# Patient Record
Sex: Male | Born: 1960 | Race: White | Hispanic: No | Marital: Married | Smoking: Never smoker
Health system: Southern US, Community
[De-identification: ages and names within clinical notes are randomized; demographics above are authoritative.]

## PROBLEM LIST (undated history)

## (undated) DIAGNOSIS — I10 Essential (primary) hypertension: Secondary | ICD-10-CM

## (undated) HISTORY — PX: CHOLECYSTECTOMY: SHX55

## (undated) HISTORY — PX: OTHER SURGICAL HISTORY: SHX169

## (undated) HISTORY — PX: UMBILICAL HERNIA REPAIR: SHX196

---

## 2016-04-23 ENCOUNTER — Encounter: Payer: Self-pay | Admitting: Nurse Practitioner

## 2016-04-23 ENCOUNTER — Ambulatory Visit (INDEPENDENT_AMBULATORY_CARE_PROVIDER_SITE_OTHER): Payer: BLUE CROSS/BLUE SHIELD | Admitting: Nurse Practitioner

## 2016-04-23 VITALS — BP 116/76 | Ht 70.5 in | Wt 243.2 lb

## 2016-04-23 DIAGNOSIS — Z8739 Personal history of other diseases of the musculoskeletal system and connective tissue: Secondary | ICD-10-CM

## 2016-04-23 DIAGNOSIS — Z131 Encounter for screening for diabetes mellitus: Secondary | ICD-10-CM | POA: Diagnosis not present

## 2016-04-23 DIAGNOSIS — Z0000 Encounter for general adult medical examination without abnormal findings: Secondary | ICD-10-CM

## 2016-04-23 DIAGNOSIS — Z125 Encounter for screening for malignant neoplasm of prostate: Secondary | ICD-10-CM | POA: Diagnosis not present

## 2016-04-23 DIAGNOSIS — E291 Testicular hypofunction: Secondary | ICD-10-CM

## 2016-04-23 MED ORDER — PHENTERMINE HCL 37.5 MG PO TABS: 37.5000 mg | ORAL_TABLET | Freq: Every day | ORAL | 0 refills | Status: DC

## 2016-04-23 NOTE — Progress Notes (Signed)
   Subjective:    Patient ID: Karl Russell, male    DOB: August 02, 1960, 55 y.o.   MRN: [id number]  HPI presents for his wellness exam as a new patient. Other than low testosterone, he denies any major health issues. 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 Has lost a little weight lately. Has a remote history of gout, over 2 years ago, none since. Controlled by avoiding certain foods such as asparagus. Married, same sexual partner. Needs eye and dental exams. Recently got insurance.     Review of Systems  Constitutional: Negative for activity change, appetite change and fatigue.  HENT: Negative for dental problem, ear pain, sinus pressure and sore throat.   Respiratory: Negative for cough, chest tightness, shortness of breath and wheezing.   Cardiovascular: Negative for chest pain.  Gastrointestinal: Negative for abdominal distention, abdominal pain, blood in stool, constipation, diarrhea, nausea and vomiting.  Genitourinary: Negative for difficulty urinating, discharge, dysuria, enuresis, frequency, genital sores, penile pain, penile swelling, scrotal swelling, testicular pain and urgency.       Objective:   Physical Exam  Constitutional: He is oriented to person, place, and time. He appears well-developed and well-nourished.  HENT:  Right Ear: External ear normal.  Left Ear: External ear normal.  Mouth/Throat: Oropharynx is clear and moist.  Neck: Normal range of motion. Neck supple. No tracheal deviation present. No thyromegaly present.  Cardiovascular: Normal rate, regular rhythm and normal heart sounds.   Pulmonary/Chest: Effort normal and breath sounds normal.  Abdominal: Soft. He exhibits no distension. There is no tenderness.  Genitourinary: Prostate normal and penis normal.  Genitourinary Comments: Testes palpated in scrotum bilat. No hernia. No stool for hemoccult.   Musculoskeletal: He exhibits no edema.  Lymphadenopathy:    He has no cervical  adenopathy.  Neurological: He is alert and oriented to person, place, and time.  Skin: Skin is warm and dry. No rash noted.  Psychiatric: He has a normal mood and affect. His behavior is normal. Thought content normal.  Vitals reviewed.         Assessment & Plan:  Routine general medical examination at a health care facility - Plan: Lipid panel  History of gout - Plan: Uric acid, Hepatic function panel  Hypogonadism in male - Plan: Testosterone  Special screening for malignant neoplasm of prostate - Plan: PSA  Encounter for screening examination for impaired glucose regulation and diabetes mellitus - Plan: Basic metabolic panel  Morbid obesity ([place])  Requests med to help jumpstart weight loss. Meds ordered this encounter  Medications  . phentermine (ADIPEX-P) 37.5 MG tablet    Sig: Take 1 tablet (37.5 mg total) by mouth daily before breakfast.    Dispense:  30 tablet    Refill:  0    Order Specific Question:   Supervising Provider    Answer:   Maggie Font   Reviewed potential adverse effects. DC med and call if any problems. Otherwise follow up in one month if he wants to continue. Discussed healthy diet and regular activity. Labs pending.  Return in about 1 year (around 04/23/2017) for physical.

## 2016-04-24 LAB — BASIC METABOLIC PANEL
BUN/Creatinine Ratio: 22 — ABNORMAL HIGH (ref 9–20)
BUN: 24 mg/dL (ref 6–24)
CHLORIDE: 102 mmol/L (ref 96–106)
CO2: 24 mmol/L (ref 18–29)
Calcium: 9.6 mg/dL (ref 8.7–10.2)
Creatinine, Ser: 1.08 mg/dL (ref 0.76–1.27)
GFR, EST AFRICAN AMERICAN: 88 mL/min/{1.73_m2} (ref >59)
GFR, EST NON AFRICAN AMERICAN: 76 mL/min/{1.73_m2} (ref >59)
Glucose: 89 mg/dL (ref 65–99)
Potassium: 5 mmol/L (ref 3.5–5.2)
Sodium: 141 mmol/L (ref 134–144)

## 2016-04-24 LAB — HEPATIC FUNCTION PANEL
ALK PHOS: 68 IU/L (ref 39–117)
ALT: 41 IU/L (ref 0–44)
AST: 25 IU/L (ref 0–40)
Albumin: 4.7 g/dL (ref 3.5–5.5)
Bilirubin Total: 0.5 mg/dL (ref 0.0–1.2)
Bilirubin, Direct: 0.13 mg/dL (ref 0.00–0.40)
TOTAL PROTEIN: 7.2 g/dL (ref 6.0–8.5)

## 2016-04-24 LAB — LIPID PANEL
Chol/HDL Ratio: 4.1 ratio units (ref 0.0–5.0)
Cholesterol, Total: 211 mg/dL — ABNORMAL HIGH (ref 100–199)
HDL: 51 mg/dL (ref >39)
LDL Calculated: 133 mg/dL — ABNORMAL HIGH (ref 0–99)
TRIGLYCERIDES: 137 mg/dL (ref 0–149)
VLDL CHOLESTEROL CAL: 27 mg/dL (ref 5–40)

## 2016-04-24 LAB — TESTOSTERONE: Testosterone: 241 ng/dL — ABNORMAL LOW (ref 264–916)

## 2016-04-24 LAB — PSA: PROSTATE SPECIFIC AG, SERUM: 3.2 ng/mL (ref 0.0–4.0)

## 2016-04-24 LAB — URIC ACID: URIC ACID: 8.5 mg/dL (ref 3.7–8.6)

## 2016-04-28 ENCOUNTER — Encounter: Payer: Self-pay | Admitting: Nurse Practitioner

## 2016-04-30 ENCOUNTER — Other Ambulatory Visit: Payer: Self-pay | Admitting: Nurse Practitioner

## 2016-04-30 ENCOUNTER — Encounter: Payer: Self-pay | Admitting: Nurse Practitioner

## 2016-04-30 DIAGNOSIS — Z1211 Encounter for screening for malignant neoplasm of colon: Secondary | ICD-10-CM

## 2016-05-01 ENCOUNTER — Encounter: Payer: Self-pay | Admitting: Family Medicine

## 2016-05-04 ENCOUNTER — Encounter (INDEPENDENT_AMBULATORY_CARE_PROVIDER_SITE_OTHER): Payer: Self-pay | Admitting: *Deleted

## 2016-05-29 ENCOUNTER — Ambulatory Visit (INDEPENDENT_AMBULATORY_CARE_PROVIDER_SITE_OTHER): Payer: BLUE CROSS/BLUE SHIELD | Admitting: Family Medicine

## 2016-05-29 ENCOUNTER — Encounter: Payer: Self-pay | Admitting: Family Medicine

## 2016-05-29 VITALS — BP 132/86 | Ht 70.5 in | Wt 245.0 lb

## 2016-05-29 DIAGNOSIS — N5201 Erectile dysfunction due to arterial insufficiency: Secondary | ICD-10-CM

## 2016-05-29 MED ORDER — SILDENAFIL CITRATE 50 MG PO TABS: 50.0000 mg | ORAL_TABLET | Freq: Every day | ORAL | 11 refills | Status: DC | PRN

## 2016-05-29 NOTE — Progress Notes (Signed)
   Subjective:    Patient ID: Karl Russell, male    DOB: 1961/03/01, 55 y.o.   MRN: [id number]  HPI  Patient arrives for a follow up on Adipex.  Pt years ago tied to lose weight with meds did not do much for pt  Fa's side of family  Overweight  Five yrs ago weighed 265 or so   Not xercising drives twenty five out of thiry   Patient notes ongoing challenges with directions. Has become more difficult in recent years. History of borderline low testosterone  Review of Systems No headache, no major weight loss or weight gain, no chest pain no back pain abdominal pain no change in bowel habits complete ROS otherwise negative     Objective:   Physical Exam  Alert vitals stable, NAD. Blood pressure good on repeat. HEENT normal. Lungs clear. Heart regular rate and rhythm.       Assessment & Plan:  Impression 1 erectile dysfunction discussed. Patient not a candidate for testosterone with levels borderline low normal. Options discussed. #2 weight loss medication has not really helped. Patient not infused about spending 100s of dollars on the new trade medications plan trial of Viagra. Refills written side effects discussed

## 2016-07-20 ENCOUNTER — Other Ambulatory Visit (INDEPENDENT_AMBULATORY_CARE_PROVIDER_SITE_OTHER): Payer: Self-pay | Admitting: *Deleted

## 2016-07-20 DIAGNOSIS — Z1211 Encounter for screening for malignant neoplasm of colon: Secondary | ICD-10-CM | POA: Insufficient documentation

## 2016-08-03 ENCOUNTER — Encounter: Payer: Self-pay | Admitting: Family Medicine

## 2016-08-03 ENCOUNTER — Ambulatory Visit (INDEPENDENT_AMBULATORY_CARE_PROVIDER_SITE_OTHER): Payer: BLUE CROSS/BLUE SHIELD | Admitting: Family Medicine

## 2016-08-03 VITALS — BP 150/98 | Ht 70.5 in | Wt 244.4 lb

## 2016-08-03 DIAGNOSIS — S46811A Strain of other muscles, fascia and tendons at shoulder and upper arm level, right arm, initial encounter: Secondary | ICD-10-CM | POA: Diagnosis not present

## 2016-08-03 DIAGNOSIS — G549 Nerve root and plexus disorder, unspecified: Secondary | ICD-10-CM

## 2016-08-03 MED ORDER — CHLORZOXAZONE 500 MG PO TABS: 500.0000 mg | ORAL_TABLET | Freq: Three times a day (TID) | ORAL | 0 refills | Status: DC | PRN

## 2016-08-03 MED ORDER — HYDROCODONE-ACETAMINOPHEN 7.5-325 MG PO TABS: ORAL_TABLET | ORAL | 0 refills | Status: DC

## 2016-08-03 MED ORDER — ETODOLAC 400 MG PO TABS: 400.0000 mg | ORAL_TABLET | Freq: Two times a day (BID) | ORAL | 0 refills | Status: DC

## 2016-08-03 NOTE — Progress Notes (Signed)
   Subjective:    Patient ID: Karl Russell, male    DOB: Nov 27, 1960, 56 y.o.   MRN: [id number]  Shoulder Pain   The pain is present in the right shoulder and right arm. This is a new problem. The current episode started in the past 7 days. The problem occurs constantly. The problem has been unchanged. The quality of the pain is described as aching and sharp. The pain is moderate. Associated symptoms include tingling. He has tried NSAIDS (prednisone) for the symptoms. The treatment provided no relief.  Patient was seen at [facility] ER yesterday for this issue.   Patient initially stretched. Felt a deep pain between scapula and spine. 30 minutes later is having a severe D spasm-like aching pain. Got worse. Bad enough To go to the emergency room. Negative x-ray blood work and EKG. Now pain worse with certain motions of the shoulder. And radiating to the right hand. No neck pain. No history of disc disease   Severe pain fight shoulder  Very bad   Went to ER via 911   Had ekg blpood work xrays all good  No injury recalled   Review of Systems  Neurological: Positive for tingling.       Objective:   Physical Exam Patient bracing right arm. No true impingement sign positive pain with rotation positive anterolateral and posterior lateral neck muscle ligament pain at palpation positive. Scapular pain distal strength sensation intact       Assessment & Plan:  Probable rhomboid/trapezius strain with spasm and secondary nerve irritation discussed plan prednisone then Lodine. Hydrocodone when necessary for pain. Local measures discussed. Muscle spasm medication prescribed work excuse given, follow-up as scheduled.

## 2016-08-05 DIAGNOSIS — Z029 Encounter for administrative examinations, unspecified: Secondary | ICD-10-CM

## 2016-08-10 ENCOUNTER — Encounter: Payer: Self-pay | Admitting: Family Medicine

## 2016-08-10 ENCOUNTER — Ambulatory Visit (INDEPENDENT_AMBULATORY_CARE_PROVIDER_SITE_OTHER): Payer: BLUE CROSS/BLUE SHIELD | Admitting: Family Medicine

## 2016-08-10 VITALS — BP 130/94 | Ht 70.5 in | Wt 244.0 lb

## 2016-08-10 DIAGNOSIS — M5412 Radiculopathy, cervical region: Secondary | ICD-10-CM | POA: Diagnosis not present

## 2016-08-10 MED ORDER — HYDROCODONE-ACETAMINOPHEN 7.5-325 MG PO TABS: ORAL_TABLET | ORAL | 0 refills | Status: DC

## 2016-08-10 MED ORDER — ETODOLAC 400 MG PO TABS: 400.0000 mg | ORAL_TABLET | Freq: Two times a day (BID) | ORAL | 0 refills | Status: DC

## 2016-08-10 NOTE — Progress Notes (Signed)
   Subjective:    Patient ID: Karl Russell, male    DOB: [birth year], 56 y.o.   MRN: [id number]  HPIFollow up on right shoulder pain. Pain radiates down arm. Pain is about the same. Taking chlorzoxazone, etodolac, and hydrocodone.   Pretty rough pain in the should  Rad to elbowAnd down through to the hand. Patient notes when he turns his neck the pain radiates all the way to his hand. Patient notes he feels weak in his hand as far as his prescription. He notes that shooting pains and numb sensations a worse in his thumb forefinger and middle finger of his right hand.   Pain is severe keeping the patient awake at night.   Not artic changed with shoulder motion  Unable to return to work per t, has to do a lot of lifting with job   Review of Systems No headache, no major weight loss or weight gain, no chest pain no back pain abdominal pain no change in bowel habits complete ROS otherwise negative     Objective:   Physical Exam Alert vitals stable, NAD.Patient in obvious pain holding his right arm. Blood pressure good on repeat. HEENT normal. Lungs clear. Heart regular rate and rhythm.  Right shoulder no impingement sign. Lateral rotation of the neck to the right leads to worsening of pain all the way into the hand. Right hand sensation diminished along the thumb forefinger and middle finger distribution. Grip diminished. 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 Accompanied by weakness and numbness and diminished reflexes. Exacerbated by rotation of the neck. Patient needs a cervical MRI ASAP. Discussed with patient. We will schedule this further recommendations based on results WSL

## 2016-08-11 ENCOUNTER — Ambulatory Visit: Payer: BLUE CROSS/BLUE SHIELD | Admitting: Family Medicine

## 2016-08-12 ENCOUNTER — Encounter: Payer: Self-pay | Admitting: Family Medicine

## 2016-08-12 ENCOUNTER — Telehealth: Payer: Self-pay | Admitting: *Deleted

## 2016-08-12 ENCOUNTER — Telehealth: Payer: Self-pay | Admitting: Family Medicine

## 2016-08-12 NOTE — Telephone Encounter (Signed)
MRI scheduled for this Friday. Need to have note to do precert

## 2016-08-12 NOTE — Telephone Encounter (Signed)
Ok change work excuse I will do dictation within next sixty minutes let brndale know

## 2016-08-12 NOTE — Telephone Encounter (Signed)
Patient was seen on 1/29 and was suppose to go back to work on  2/2 but MRI is that day also and needing work note redone for 2/5 with restriction if MRI shows anything.He would like results as soon as possible after its done.

## 2016-08-14 ENCOUNTER — Other Ambulatory Visit: Payer: Self-pay

## 2016-08-14 ENCOUNTER — Encounter: Payer: Self-pay | Admitting: Family Medicine

## 2016-08-14 ENCOUNTER — Ambulatory Visit (HOSPITAL_COMMUNITY)
Admission: RE | Admit: 2016-08-14 | Discharge: 2016-08-14 | Disposition: A | Discharge status: Home / Self Care | Payer: BLUE CROSS/BLUE SHIELD | Source: Ambulatory Visit | Attending: Family Medicine | Admitting: Family Medicine

## 2016-08-14 ENCOUNTER — Telehealth: Payer: Self-pay | Admitting: Family Medicine

## 2016-08-14 DIAGNOSIS — M5412 Radiculopathy, cervical region: Secondary | ICD-10-CM | POA: Diagnosis not present

## 2016-08-14 DIAGNOSIS — M5020 Other cervical disc displacement, unspecified cervical region: Secondary | ICD-10-CM

## 2016-08-14 DIAGNOSIS — M50223 Other cervical disc displacement at C6-C7 level: Secondary | ICD-10-CM | POA: Insufficient documentation

## 2016-08-14 DIAGNOSIS — M50222 Other cervical disc displacement at C5-C6 level: Secondary | ICD-10-CM | POA: Insufficient documentation

## 2016-08-14 DIAGNOSIS — M4802 Spinal stenosis, cervical region: Secondary | ICD-10-CM | POA: Diagnosis not present

## 2016-08-14 DIAGNOSIS — M50221 Other cervical disc displacement at C4-C5 level: Secondary | ICD-10-CM | POA: Diagnosis not present

## 2016-08-14 MED ORDER — HYDROCODONE-ACETAMINOPHEN 7.5-325 MG PO TABS: ORAL_TABLET | ORAL | 0 refills | Status: DC

## 2016-08-14 NOTE — Telephone Encounter (Signed)
Write for another 56, same rx

## 2016-08-14 NOTE — Telephone Encounter (Signed)
Faxed information to [facility] in [place] - they will call pt directly to schedule, pt aware   Pt will need another Rx for pain meds to take with him  Planning to fly up there Monday or Tuesday    Please advise

## 2016-08-14 NOTE — Telephone Encounter (Signed)
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  They are going to fly him out and would like this done ASAP.  Below is the information to the requested doctor:  Dr. Abran Richard Grief [phone] [facility] in [place], [place]

## 2016-08-14 NOTE — Addendum Note (Signed)
Addended by: Jesusita Oka on: 08/14/2016 04:28 PM   Modules accepted: Orders

## 2016-08-14 NOTE — Telephone Encounter (Signed)
If pt in agreement with that go ahead and ref to [facility], needs appt soon

## 2016-08-14 NOTE — Telephone Encounter (Signed)
Wife notified script ready for pickup

## 2016-09-11 ENCOUNTER — Encounter: Payer: Self-pay | Admitting: Family Medicine

## 2016-09-11 ENCOUNTER — Telehealth (INDEPENDENT_AMBULATORY_CARE_PROVIDER_SITE_OTHER): Payer: Self-pay | Admitting: *Deleted

## 2016-09-11 ENCOUNTER — Encounter (INDEPENDENT_AMBULATORY_CARE_PROVIDER_SITE_OTHER): Payer: Self-pay | Admitting: *Deleted

## 2016-09-11 NOTE — Telephone Encounter (Signed)
Patient needs trilyte 

## 2016-09-14 MED ORDER — PEG 3350-KCL-NA BICARB-NACL 420 G PO SOLR: 4000.0000 mL | Freq: Once | ORAL | 0 refills | Status: AC

## 2016-09-14 MED ORDER — AMLODIPINE BESY-BENAZEPRIL HCL 5-10 MG PO CAPS: 1.0000 | ORAL_CAPSULE | Freq: Every day | ORAL | 5 refills | Status: DC

## 2016-09-17 ENCOUNTER — Telehealth: Payer: Self-pay | Admitting: Family Medicine

## 2016-09-17 ENCOUNTER — Other Ambulatory Visit: Payer: Self-pay

## 2016-09-17 MED ORDER — AMLODIPINE BESY-BENAZEPRIL HCL 5-10 MG PO CAPS: 1.0000 | ORAL_CAPSULE | Freq: Every day | ORAL | 5 refills | Status: DC

## 2016-09-17 NOTE — Telephone Encounter (Signed)
Medication sent to requested pharmacy. Patient's wife notified.

## 2016-09-17 NOTE — Telephone Encounter (Signed)
Patient had amLODipine-benazepril (LOTREL) 5-10 MG called in the other day, but it was sent to the wrong pharmacy.  Patients spouse is requesting this to be sent to [facility], [place], [place]. W/ 2 refills.

## 2016-09-23 ENCOUNTER — Telehealth (INDEPENDENT_AMBULATORY_CARE_PROVIDER_SITE_OTHER): Payer: Self-pay | Admitting: *Deleted

## 2016-09-23 NOTE — Telephone Encounter (Signed)
Referring MD/PCP: steve luking   Procedure: tcs  Reason/Indication:  screening  Has patient had this procedure before?  no  If so, when, by whom and where?    Is there a family history of colon cancer?  no  Who?  What age when diagnosed?    Is patient diabetic?   no      Does patient have prosthetic heart valve or mechanical valve?  no  Do you have a pacemaker?  no  Has patient ever had endocarditis? no  Has patient had joint replacement within last 12 months?  no  Does patient tend to be constipated or take laxatives? no  Does patient have a history of alcohol/drug use?  no  Is patient on Coumadin, Plavix and/or Aspirin? no  Medications: vit c, MVI  Allergies: codeine  Medication Adjustment per Dr Laural Golden:   Procedure date & time:

## 2016-10-21 ENCOUNTER — Ambulatory Visit (HOSPITAL_COMMUNITY): Admit: 2016-10-21 | Payer: BLUE CROSS/BLUE SHIELD | Admitting: Internal Medicine

## 2016-10-21 ENCOUNTER — Encounter (HOSPITAL_COMMUNITY): Payer: Self-pay

## 2016-10-21 SURGERY — COLONOSCOPY
Anesthesia: Moderate Sedation

## 2016-11-04 ENCOUNTER — Other Ambulatory Visit (INDEPENDENT_AMBULATORY_CARE_PROVIDER_SITE_OTHER): Payer: Self-pay | Admitting: *Deleted

## 2016-11-04 DIAGNOSIS — Z1211 Encounter for screening for malignant neoplasm of colon: Secondary | ICD-10-CM

## 2017-01-12 ENCOUNTER — Encounter (INDEPENDENT_AMBULATORY_CARE_PROVIDER_SITE_OTHER): Payer: Self-pay | Admitting: *Deleted

## 2017-01-12 ENCOUNTER — Telehealth (INDEPENDENT_AMBULATORY_CARE_PROVIDER_SITE_OTHER): Payer: Self-pay | Admitting: *Deleted

## 2017-01-12 DIAGNOSIS — Z86010 Personal history of colonic polyps: Secondary | ICD-10-CM

## 2017-01-12 NOTE — Telephone Encounter (Signed)
Patient needs trilyte -- screening

## 2017-01-14 MED ORDER — PEG 3350-KCL-NA BICARB-NACL 420 G PO SOLR: 4000.0000 mL | Freq: Once | ORAL | 0 refills | Status: AC

## 2017-01-25 ENCOUNTER — Ambulatory Visit: Payer: BLUE CROSS/BLUE SHIELD | Admitting: Nurse Practitioner

## 2017-01-25 ENCOUNTER — Ambulatory Visit: Payer: BLUE CROSS/BLUE SHIELD | Admitting: Family Medicine

## 2017-02-05 ENCOUNTER — Telehealth (INDEPENDENT_AMBULATORY_CARE_PROVIDER_SITE_OTHER): Payer: Self-pay | Admitting: *Deleted

## 2017-02-05 NOTE — Telephone Encounter (Signed)
Referring MD/PCP: steve luking   Procedure: tcs  Reason/Indication:  screening  Has patient had this procedure before?  no             If so, when, by whom and where?    Is there a family history of colon cancer?  no             Who?  What age when diagnosed?    Is patient diabetic?   no                                                  Does patient have prosthetic heart valve or mechanical valve?  no  Do you have a pacemaker?  no  Has patient ever had endocarditis? no  Has patient had joint replacement within last 12 months?  no  Does patient tend to be constipated or take laxatives? no  Does patient have a history of alcohol/drug use?  no  Is patient on Coumadin, Plavix and/or Aspirin? no  Medications: vit c, MVI  Allergies: codeine  Medication Adjustment per Dr Laural Golden:   Procedure date & time: 03/04/17 at 930

## 2017-02-09 NOTE — Telephone Encounter (Signed)
agree

## 2017-03-04 ENCOUNTER — Encounter (HOSPITAL_COMMUNITY)
Admission: RE | Disposition: A | Discharge status: Home / Self Care | Payer: Self-pay | Source: Ambulatory Visit | Attending: Internal Medicine

## 2017-03-04 ENCOUNTER — Encounter (HOSPITAL_COMMUNITY): Payer: Self-pay | Admitting: *Deleted

## 2017-03-04 ENCOUNTER — Ambulatory Visit (HOSPITAL_COMMUNITY)
Admission: RE | Admit: 2017-03-04 | Discharge: 2017-03-04 | Disposition: A | Discharge status: Home / Self Care | Payer: BLUE CROSS/BLUE SHIELD | Source: Ambulatory Visit | Attending: Internal Medicine | Admitting: Internal Medicine

## 2017-03-04 DIAGNOSIS — I10 Essential (primary) hypertension: Secondary | ICD-10-CM | POA: Diagnosis not present

## 2017-03-04 DIAGNOSIS — K6289 Other specified diseases of anus and rectum: Secondary | ICD-10-CM

## 2017-03-04 DIAGNOSIS — D1779 Benign lipomatous neoplasm of other sites: Secondary | ICD-10-CM | POA: Diagnosis not present

## 2017-03-04 DIAGNOSIS — Z1211 Encounter for screening for malignant neoplasm of colon: Secondary | ICD-10-CM | POA: Insufficient documentation

## 2017-03-04 DIAGNOSIS — K5730 Diverticulosis of large intestine without perforation or abscess without bleeding: Secondary | ICD-10-CM | POA: Insufficient documentation

## 2017-03-04 DIAGNOSIS — Z79899 Other long term (current) drug therapy: Secondary | ICD-10-CM | POA: Insufficient documentation

## 2017-03-04 DIAGNOSIS — K644 Residual hemorrhoidal skin tags: Secondary | ICD-10-CM | POA: Insufficient documentation

## 2017-03-04 DIAGNOSIS — D175 Benign lipomatous neoplasm of intra-abdominal organs: Secondary | ICD-10-CM | POA: Diagnosis not present

## 2017-03-04 HISTORY — DX: Essential (primary) hypertension: I10

## 2017-03-04 HISTORY — PX: COLONOSCOPY: SHX5424

## 2017-03-04 SURGERY — COLONOSCOPY
Anesthesia: Moderate Sedation

## 2017-03-04 MED ORDER — MIDAZOLAM HCL 5 MG/5ML IJ SOLN
INTRAMUSCULAR | Status: AC
  Filled 2017-03-04: qty 10

## 2017-03-04 MED ORDER — MIDAZOLAM HCL 5 MG/5ML IJ SOLN
INTRAMUSCULAR | Status: DC | PRN
  Administered 2017-03-04: 1 mg via INTRAVENOUS
  Administered 2017-03-04 (×2): 2 mg via INTRAVENOUS
  Administered 2017-03-04 (×2): 1 mg via INTRAVENOUS

## 2017-03-04 MED ORDER — MEPERIDINE HCL 50 MG/ML IJ SOLN
INTRAMUSCULAR | Status: DC | PRN
  Administered 2017-03-04 (×2): 25 mg via INTRAVENOUS

## 2017-03-04 MED ORDER — SODIUM CHLORIDE 0.9 % IV SOLN
INTRAVENOUS | Status: DC
  Administered 2017-03-04: 09:00:00 via INTRAVENOUS

## 2017-03-04 MED ORDER — MEPERIDINE HCL 50 MG/ML IJ SOLN
INTRAMUSCULAR | Status: DC
Start: 2017-03-04 — End: 2017-03-04
  Filled 2017-03-04: qty 1

## 2017-03-04 MED ORDER — STERILE WATER FOR IRRIGATION IR SOLN
Status: DC | PRN
  Administered 2017-03-04: 100 mL

## 2017-03-04 NOTE — Discharge Instructions (Signed)
High-Fiber Diet Fiber, also called dietary fiber, is a type of carbohydrate found in fruits, vegetables, whole grains, and beans. A high-fiber diet can have many health benefits. Your health care provider may recommend a high-fiber diet to help:  Prevent constipation. Fiber can make your bowel movements more regular.  Lower your cholesterol.  Relieve hemorrhoids, uncomplicated diverticulosis, or irritable bowel syndrome.  Prevent overeating as part of a weight-loss plan.  Prevent heart disease, type 2 diabetes, and certain cancers.  What is my plan? The recommended daily intake of fiber includes:  38 grams for men under age 67.  31 grams for men over age 71.  61 grams for women under age 60.  32 grams for women over age 13.  You can get the recommended daily intake of dietary fiber by eating a variety of fruits, vegetables, grains, and beans. Your health care provider may also recommend a fiber supplement if it is not possible to get enough fiber through your diet. What do I need to know about a high-fiber diet?  Fiber supplements have not been widely studied for their effectiveness, so it is better to get fiber through food sources.  Always check the fiber content on thenutrition facts label of any prepackaged food. Look for foods that contain at least 5 grams of fiber per serving.  Ask your dietitian if you have questions about specific foods that are related to your condition, especially if those foods are not listed in the following section.  Increase your daily fiber consumption gradually. Increasing your intake of dietary fiber too quickly may cause bloating, cramping, or gas.  Drink plenty of water. Water helps you to digest fiber. What foods can I eat? Grains Whole-grain breads. Multigrain cereal. Oats and oatmeal. Brown rice. Barley. Bulgur wheat. [place]. Bran muffins. Popcorn. Rye wafer crackers. Vegetables Sweet potatoes. Spinach. Kale. Artichokes. Cabbage.  Broccoli. Green peas. Carrots. Squash. Fruits Berries. Pears. Apples. Oranges. Avocados. Prunes and raisins. Dried figs. Meats and Other Protein Sources Navy, kidney, pinto, and soy beans. Split peas. Lentils. Nuts and seeds. Dairy Fiber-fortified yogurt. Beverages Fiber-fortified soy milk. Fiber-fortified orange juice. Other Fiber bars. The items listed above may not be a complete list of recommended foods or beverages. Contact your dietitian for more options. What foods are not recommended? Grains White bread. Pasta made with refined flour. White rice. Vegetables Fried potatoes. Canned vegetables. Well-cooked vegetables. Fruits Fruit juice. Cooked, strained fruit. Meats and Other Protein Sources Fatty cuts of meat. Fried Sales executive or fried fish. Dairy Milk. Yogurt. Cream cheese. Sour cream. Beverages Soft drinks. Other Cakes and pastries. Butter and oils. The items listed above may not be a complete list of foods and beverages to avoid. Contact your dietitian for more information. What are some tips for including high-fiber foods in my diet?  Eat a wide variety of high-fiber foods.  Make sure that half of all grains consumed each day are whole grains.  Replace breads and cereals made from refined flour or white flour with whole-grain breads and cereals.  Replace white rice with brown rice, bulgur wheat, or millet.  Start the day with a breakfast that is high in fiber, such as a cereal that contains at least 5 grams of fiber per serving.  Use beans in place of meat in soups, salads, or pasta.  Eat high-fiber snacks, such as berries, raw vegetables, nuts, or popcorn. This information is not intended to replace advice given to you by your health care provider. Make sure you discuss any  questions you have with your health care provider. Document Released: 06/29/2005 Document Revised: 12/05/2015 Document Reviewed: 12/12/2013 Elsevier Interactive Patient Education  2017  [place]. Colonoscopy, Adult, Care After This sheet gives you information about how to care for yourself after your procedure. Your health care provider may also give you more specific instructions. If you have problems or questions, contact your health care provider. What can I expect after the procedure? After the procedure, it is common to have:  A small amount of blood in your stool for 24 hours after the procedure.  Some gas.  Mild abdominal cramping or bloating.  Follow these instructions at home: General instructions   For the first 24 hours after the procedure: ? Do not drive or use machinery. ? Do not sign important documents. ? Do not drink alcohol. ? Do your regular daily activities at a slower pace than normal. ? Eat soft, easy-to-digest foods. ? Rest often.  Take over-the-counter or prescription medicines only as told by your health care provider.  It is up to you to get the results of your procedure. Ask your health care provider, or the department performing the procedure, when your results will be ready. Relieving cramping and bloating  Try walking around when you have cramps or feel bloated.  Apply heat to your abdomen as told by your health care provider. Use a heat source that your health care provider recommends, such as a moist heat pack or a heating pad. ? Place a towel between your skin and the heat source. ? Leave the heat on for 20-30 minutes. ? Remove the heat if your skin turns bright red. This is especially important if you are unable to feel pain, heat, or cold. You may have a greater risk of getting burned. Eating and drinking  Drink enough fluid to keep your urine clear or pale yellow.  Resume your normal diet as instructed by your health care provider. Avoid heavy or fried foods that are hard to digest.  Avoid drinking alcohol for as long as instructed by your health care provider. Contact a health care provider if:  You have blood in  your stool 2-3 days after the procedure. Get help right away if:  You have more than a small spotting of blood in your stool.  You pass large blood clots in your stool.  Your abdomen is swollen.  You have nausea or vomiting.  You have a fever.  You have increasing abdominal pain that is not relieved with medicine. This information is not intended to replace advice given to you by your health care provider. Make sure you discuss any questions you have with your health care provider. Document Released: 02/11/2004 Document Revised: 03/23/2016 Document Reviewed: 09/10/2015 Elsevier Interactive Patient Education  2018 [place] usual medications. High fiber diet. No driving for 24 hours. Next screening exam in 10 years.

## 2017-03-04 NOTE — H&P (Signed)
Karl Russell is an 56 y.o. male.   Chief Complaint: Patient is here for colonoscopy. HPI: Patient is 56 year old Caucasian male who is here for screening colonoscopy. He denies abdominal pain change in bowel habits or rectal bleeding. Family history is negative for CRC.  Past Medical History:  Diagnosis Date  . Hypertension     Past Surgical History:  Procedure Laterality Date  . CHOLECYSTECTOMY    . scalp cysts removed    . UMBILICAL HERNIA REPAIR          Neck surgery for disc disease 2017  Family History  Problem Relation Age of Onset  . Cancer Mother        smoker  . COPD Father        smoker  . Diabetes Father   . Hypertension Father   . COPD Maternal Grandmother    Social History:  reports that he has never smoked. He has never used smokeless tobacco. He reports that he drinks alcohol. He reports that he does not use drugs.  Allergies:  Allergies  Allergen Reactions  . Codeine Itching and Rash    Medications Prior to Admission  Medication Sig Dispense Refill  . amLODipine-benazepril (LOTREL) 5-10 MG capsule Take 1 capsule by mouth daily. 30 capsule 5  . cholecalciferol (VITAMIN D) 1000 units tablet Take 2,000 Units by mouth daily.    . Multiple Vitamins-Minerals (ONE-A-DAY 50 PLUS PO) Take 1 tablet by mouth daily.    . naproxen sodium (ANAPROX) 220 MG tablet Take 440 mg by mouth 2 (two) times daily as needed (pain).      No results found for this or any previous visit (from the past 48 hour(s)). No results found.  ROS  Blood pressure 121/87, pulse (!) 48, temperature 98.4 F (36.9 C), temperature source Oral, resp. rate 15, height 5' 10.5" (1.791 m), weight 240 lb (108.9 kg), SpO2 99 %. Physical Exam  Constitutional: He appears well-developed and well-nourished.  HENT:  Mouth/Throat: Oropharynx is clear and moist.  Eyes: Conjunctivae are normal. No scleral icterus.  Neck: No thyromegaly present.  Cardiovascular: Normal rate, regular rhythm and normal  heart sounds.   No murmur heard. Respiratory: Effort normal and breath sounds normal.  GI: Soft. He exhibits no distension and no mass. There is no tenderness.  Musculoskeletal: He exhibits no edema.  Lymphadenopathy:    He has no cervical adenopathy.  Neurological: He is alert.  Skin: Skin is warm and dry.     Assessment/Plan Average risk screening colonoscopy.  Hildred Laser, MD 03/04/2017, 9:39 AM

## 2017-03-04 NOTE — Op Note (Signed)
[facility] Patient Name: Karl Russell Procedure Date: 03/04/2017 9:05 AM MRN: [id number] Date of Birth: [birth year] Attending MD: Hildred Laser , MD CSN: [id number] Age: 56 Admit Type: Outpatient Procedure:                Colonoscopy Indications:              Screening for colorectal malignant neoplasm Providers:                Hildred Laser, MD, Rosina Lowenstein, RN, Aram Candela Referring MD:             Grace Bushy. Wolfgang Phoenix, MD Medicines:                Meperidine 50 mg IV, Midazolam 7 mg IV Complications:            No immediate complications. Estimated Blood Loss:     Estimated blood loss: none. Procedure:                Pre-Anesthesia Assessment:                           - Prior to the procedure, a History and Physical                            was performed, and patient medications and                            allergies were reviewed. The patient's tolerance of                            previous anesthesia was also reviewed. The risks                            and benefits of the procedure and the sedation                            options and risks were discussed with the patient.                            All questions were answered, and informed consent                            was obtained. Prior Anticoagulants: The patient has                            taken no previous anticoagulant or antiplatelet                            agents. ASA Grade Assessment: II - A patient with                            mild systemic disease. After reviewing the risks                            and benefits, the patient was deemed in  satisfactory condition to undergo the procedure.                           After obtaining informed consent, the colonoscope                            was passed under direct vision. Throughout the                            procedure, the patient's blood pressure, pulse, and                            oxygen saturations  were monitored continuously. The                            EC-[id number] ([id number]) scope was introduced through                            the anus and advanced to the the cecum, identified                            by appendiceal orifice and ileocecal valve. The                            colonoscopy was performed without difficulty. The                            patient tolerated the procedure well. The quality                            of the bowel preparation was excellent. The                            ileocecal valve, appendiceal orifice, and rectum                            were photographed. Scope In: 9:49:52 AM Scope Out: 10:11:50 AM Scope Withdrawal Time: 0 hours 14 minutes 31 seconds  Total Procedure Duration: 0 hours 21 minutes 58 seconds  Findings:      The perianal and digital rectal examinations were normal.      There was a medium-sized lipoma, in the ascending colon.      There was a small lipoma, in the sigmoid colon.      A few small-mouthed diverticula were found in the sigmoid colon.      External hemorrhoids were found during retroflexion. The hemorrhoids       were small.      Anal papilla(e) were hypertrophied. Impression:               - Medium-sized lipoma in the ascending colon.                           - Small lipoma in the sigmoid colon.                           -  Diverticulosis in the sigmoid colon.                           - External hemorrhoids.                           - Anal papilla.                           - No specimens collected. Moderate Sedation:      Moderate (conscious) sedation was administered by the endoscopy nurse       and supervised by the endoscopist. The following parameters were       monitored: oxygen saturation, heart rate, blood pressure, CO2       capnography and response to care. Total physician intraservice time was       28 minutes. Recommendation:           - Patient has a contact number available for                             emergencies. The signs and symptoms of potential                            delayed complications were discussed with the                            patient. Return to normal activities tomorrow.                            Written discharge instructions were provided to the                            patient.                           - High fiber diet today.                           - Continue present medications.                           - Repeat colonoscopy in 10 years for screening                            purposes. Procedure Code(s):        --- Professional ---                           [phone], Colonoscopy, flexible; diagnostic, including                            collection of specimen(s) by brushing or washing,                            when performed (separate procedure)  [id number], Moderate sedation services provided by the                            same physician or other qualified health care                            professional performing the diagnostic or                            therapeutic service that the sedation supports,                            requiring the presence of an independent trained                            observer to assist in the monitoring of the                            patient's level of consciousness and physiological                            status; initial 15 minutes of intraservice time,                            patient age 62 years or older                           [phone], Moderate sedation services; each additional                            15 minutes intraservice time Diagnosis Code(s):        --- Professional ---                           Z12.11, Encounter for screening for malignant                            neoplasm of colon                           D17.5, Benign lipomatous neoplasm of                            intra-abdominal organs                           K64.4, Residual hemorrhoidal  skin tags                           K62.89, Other specified diseases of anus and rectum                           K57.30, Diverticulosis of large intestine without                            perforation or abscess  without bleeding CPT copyright 2016 [employer]. All rights reserved. The codes documented in this report are preliminary and upon coder review may  be revised to meet current compliance requirements. Hildred Laser, MD Hildred Laser, MD 03/04/2017 10:24:40 AM This report has been signed electronically. Number of Addenda: 0

## 2017-03-08 ENCOUNTER — Encounter (HOSPITAL_COMMUNITY): Payer: Self-pay | Admitting: Internal Medicine

## 2018-02-09 ENCOUNTER — Other Ambulatory Visit: Payer: Self-pay | Admitting: *Deleted

## 2018-02-09 ENCOUNTER — Telehealth: Payer: Self-pay | Admitting: Family Medicine

## 2018-02-09 MED ORDER — AMLODIPINE BESY-BENAZEPRIL HCL 5-10 MG PO CAPS: 1.0000 | ORAL_CAPSULE | Freq: Every day | ORAL | 0 refills | Status: AC

## 2018-02-09 NOTE — Telephone Encounter (Signed)
This patient may have a 30-day supply needs office visit

## 2018-02-09 NOTE — Telephone Encounter (Signed)
Last visit was over one year ago. 08/10/16 for shoulder pain

## 2018-02-09 NOTE — Telephone Encounter (Signed)
Refill sent. Left message to return call to let pt know 30 day supply sent in and he needs office visit

## 2018-02-09 NOTE — Telephone Encounter (Signed)
Patient is requesting a refill on his amLODipine-benazepril (LOTREL) 5-10 MG capsule.  He is completely out.  [place], [place]

## 2018-02-10 NOTE — Telephone Encounter (Signed)
Pt wife returned call. They are currently in [place] and will be living up there. Pt wife states that she will need to call back and get records transferred.

## 2018-02-10 NOTE — Telephone Encounter (Signed)
I agree

## 2018-08-23 IMAGING — MR MR CERVICAL SPINE W/O CM
4 of 5 series · 21 of 48 positions shown · non-contrast
Comparison: None.

CLINICAL DATA: Cervical neuropathic pain.  Right arm pain

EXAM:
MRI CERVICAL SPINE WITHOUT CONTRAST
TECHNIQUE: Multiplanar, multisequence MR imaging of the cervical spine was
performed. No intravenous contrast was administered.

[Series 3: T2 · sagittal · 3.0mm · 0.47mm/px · 6 of 14 slices shown (1 of 2)]
[im 1/14]
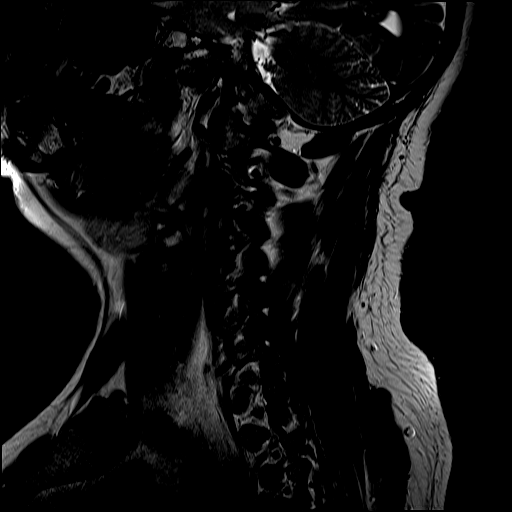
[im 3/14]
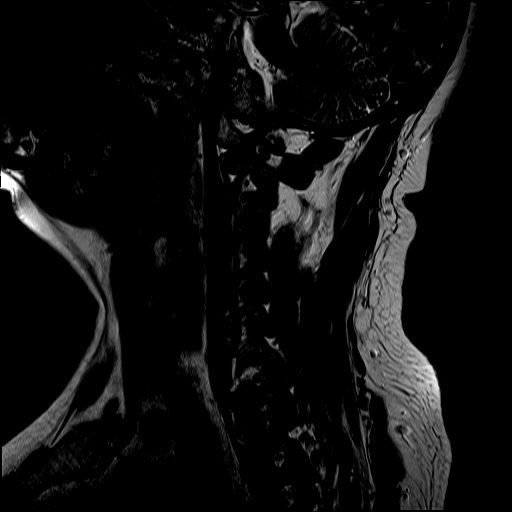
[im 6/14]
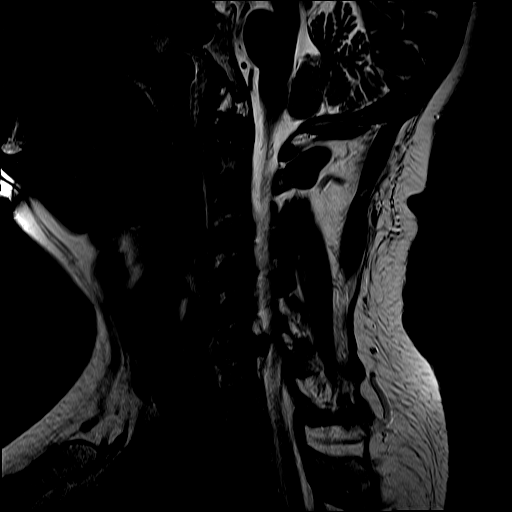
[im 8/14]
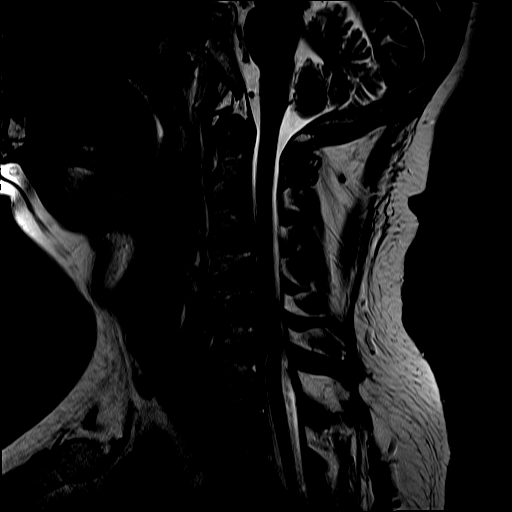
[im 11/14]
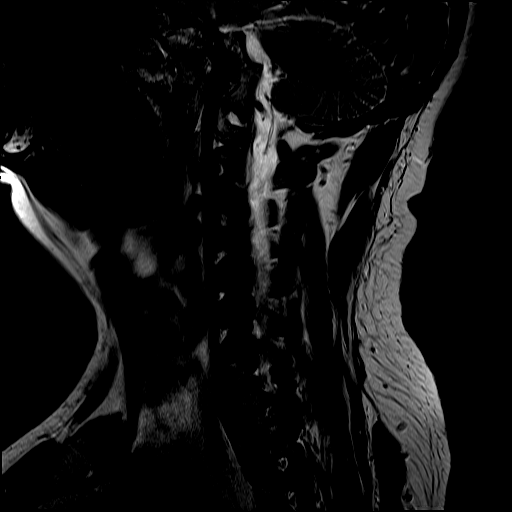
[im 14/14]
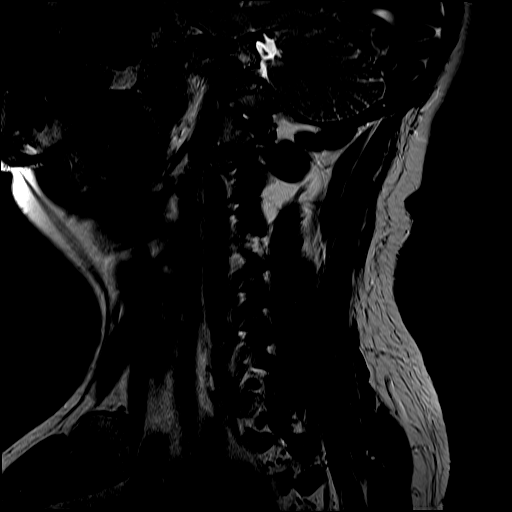

[Series 4: FLAIR · sagittal · 3.0mm · 0.49mm/px · 3 of 14 slices shown]
[im 3/14]
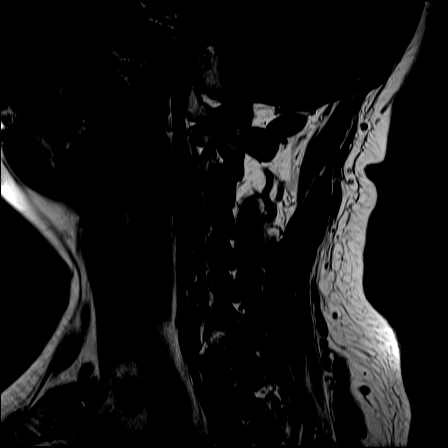
[im 8/14]
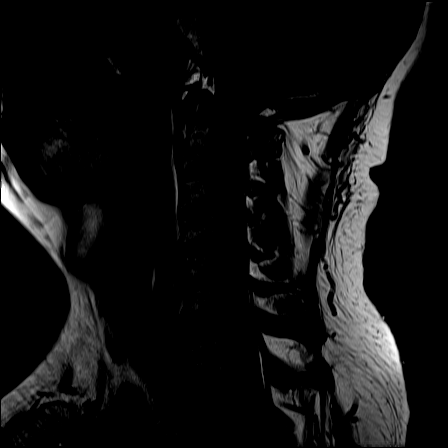
[im 14/14]
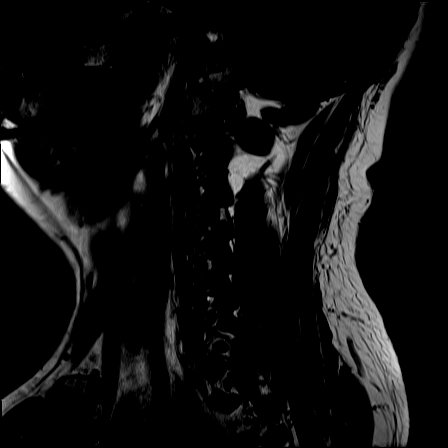

[Series 5: ir sagital · sagittal · 3.0mm · 0.27mm/px · 3 of 14 slices shown]
[im 3/14]
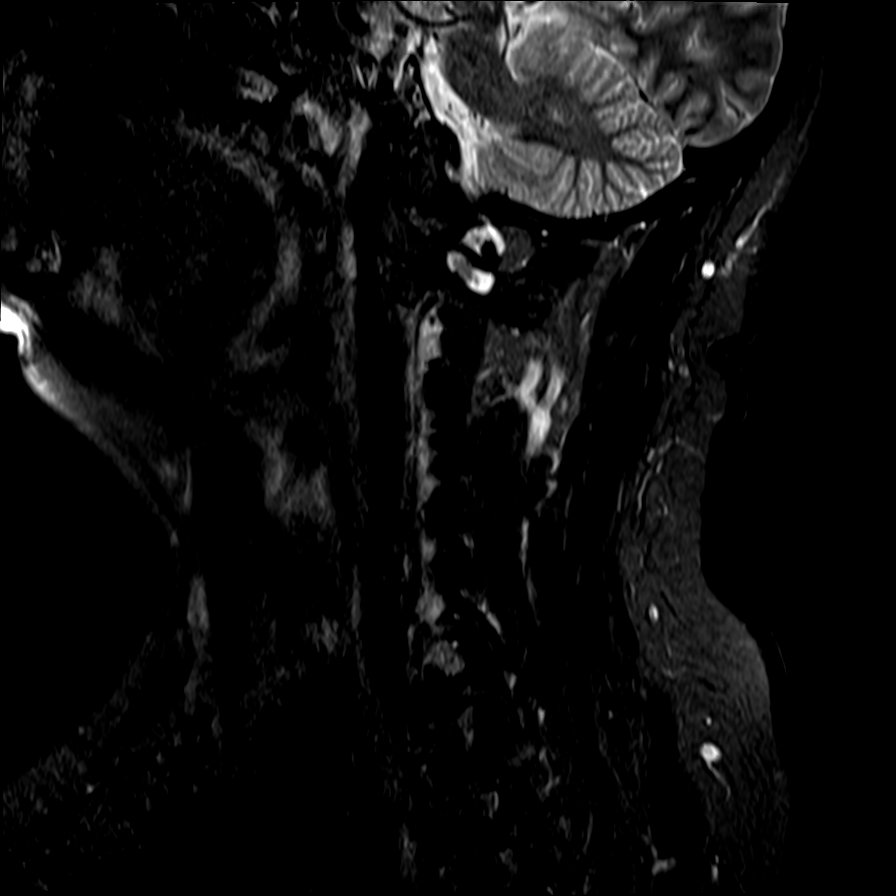
[im 8/14]
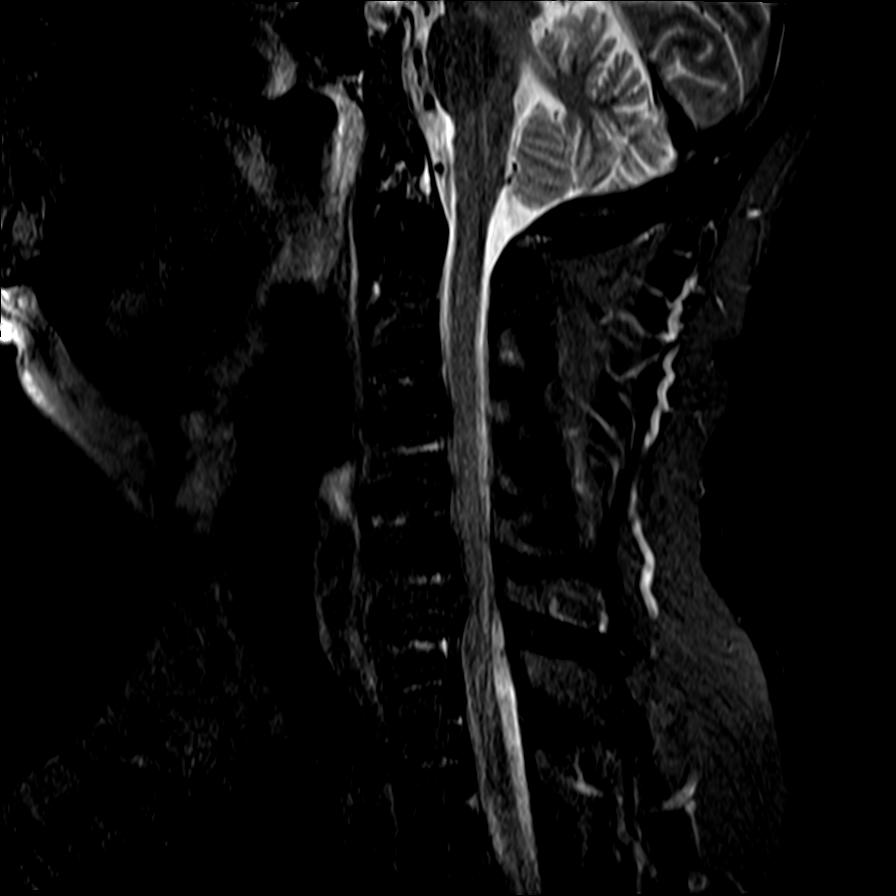
[im 14/14]
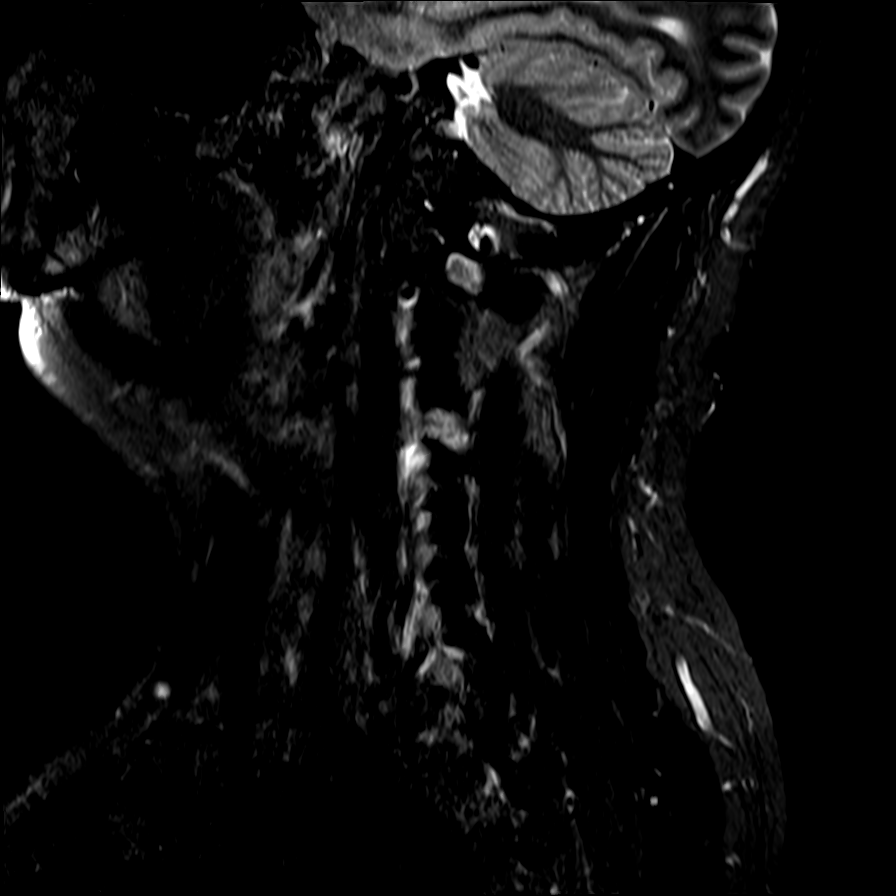

[Series 7: T2 · axial · 3.0mm · 0.37mm/px · z∈[-105,+0]mm · 9 of 34 slices shown (2 of 2)]
[im 1/34]
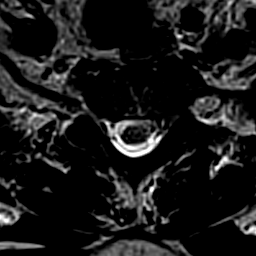
[im 5/34]
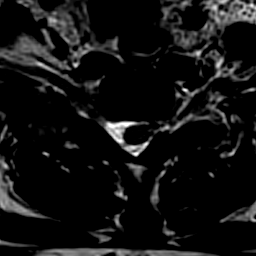
[im 10/34]
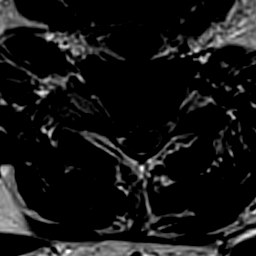
[im 15/34]
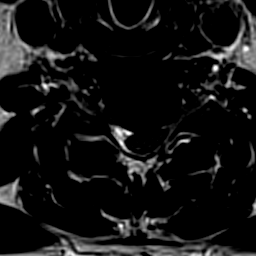
[im 17/34]
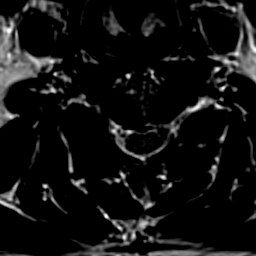
[im 19/34]
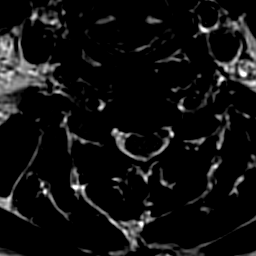
[im 24/34]
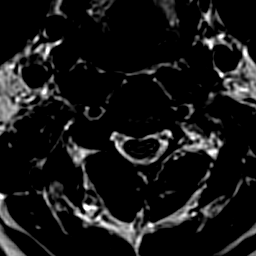
[im 29/34]
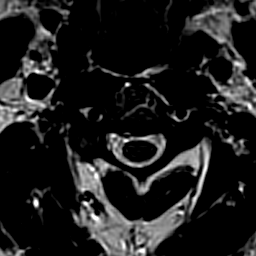
[im 34/34]
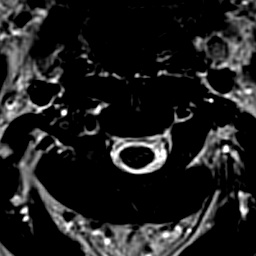

[21 of 48 positions shown; findings below may reference images not displayed]

FINDINGS: Alignment: Normal.  Straightening of the cervical lordosis.

Vertebrae: Negative for fracture or mass.  Normal bone marrow.

Cord: Image quality degraded by motion. Allowing for this, cord
signal appears normal.

Posterior Fossa, vertebral arteries, paraspinal tissues: Negative

Disc levels:

C2-3:  Negative

C3-4: Small central disc protrusion. Mild facet degeneration. Mild
spinal stenosis and mild right foraminal narrowing.

C4-5: Disc degeneration with broad-based central disc protrusion.
Cord flattening and mild spinal stenosis.

C5-6: Central disc protrusion and spurring with cord flattening and
mild spinal stenosis. Neural foramina patent. Mild facet
degeneration

C6-7: Large central and right-sided disc protrusion extending into
the right foramen. There is impingement of the right C7 nerve root
in the foramen. There is a probable free fragment in the epidural
space. There is cord flattening and moderate spinal stenosis.

C7-T1:  Negative
IMPRESSION: Mild spinal stenosis at C3-4 at C4-5 due to central disc protrusions

Mild spinal stenosis at C5-6 due to central disc protrusion and mild
spurring.

Large central and right-sided disc protrusion C6-7 extending into
the right foramen. Impingement of the right C7 nerve root. Cord
flattening and moderate spinal stenosis.
